# Patient Record
Sex: Male | Born: 1990 | Race: Black or African American | Hispanic: No | Marital: Married | Smoking: Never smoker
Health system: Southern US, Community
[De-identification: ages and names within clinical notes are randomized; demographics above are authoritative.]

## PROBLEM LIST (undated history)

## (undated) DIAGNOSIS — W3400XA Accidental discharge from unspecified firearms or gun, initial encounter: Secondary | ICD-10-CM

## (undated) DIAGNOSIS — S71139A Puncture wound without foreign body, unspecified thigh, initial encounter: Secondary | ICD-10-CM

---

## 2015-12-17 ENCOUNTER — Emergency Department (HOSPITAL_COMMUNITY): Payer: Self-pay

## 2015-12-17 ENCOUNTER — Emergency Department (HOSPITAL_COMMUNITY)
Admission: EM | Admit: 2015-12-17 | Discharge: 2015-12-17 | Disposition: A | Discharge status: Home / Self Care | Payer: Self-pay | Attending: Emergency Medicine | Admitting: Emergency Medicine

## 2015-12-17 ENCOUNTER — Encounter (HOSPITAL_COMMUNITY): Payer: Self-pay

## 2015-12-17 DIAGNOSIS — S71139A Puncture wound without foreign body, unspecified thigh, initial encounter: Secondary | ICD-10-CM

## 2015-12-17 DIAGNOSIS — S71139D Puncture wound without foreign body, unspecified thigh, subsequent encounter: Secondary | ICD-10-CM

## 2015-12-17 DIAGNOSIS — M79605 Pain in left leg: Secondary | ICD-10-CM

## 2015-12-17 DIAGNOSIS — W3400XA Accidental discharge from unspecified firearms or gun, initial encounter: Secondary | ICD-10-CM | POA: Insufficient documentation

## 2015-12-17 DIAGNOSIS — S71102D Unspecified open wound, left thigh, subsequent encounter: Secondary | ICD-10-CM | POA: Insufficient documentation

## 2015-12-17 DIAGNOSIS — M795 Residual foreign body in soft tissue: Secondary | ICD-10-CM

## 2015-12-17 DIAGNOSIS — W3400XD Accidental discharge from unspecified firearms or gun, subsequent encounter: Secondary | ICD-10-CM | POA: Insufficient documentation

## 2015-12-17 HISTORY — DX: Accidental discharge from unspecified firearms or gun, initial encounter: W34.00XA

## 2015-12-17 HISTORY — DX: Puncture wound without foreign body, unspecified thigh, initial encounter: S71.139A

## 2015-12-17 MED ORDER — TRAMADOL HCL 50 MG PO TABS: 50.0000 mg | ORAL_TABLET | Freq: Four times a day (QID) | ORAL | 0 refills | Status: DC | PRN

## 2015-12-17 MED ORDER — OXYCODONE-ACETAMINOPHEN 5-325 MG PO TABS
2.0000 | ORAL_TABLET | Freq: Once | ORAL | Status: AC
  Administered 2015-12-17: 2 via ORAL
  Filled 2015-12-17: qty 2

## 2015-12-17 NOTE — ED Notes (Signed)
Bed: WA03 Expected date:  Expected time:  Means of arrival:  Comments: 25 yo multiple complaints

## 2015-12-17 NOTE — Discharge Instructions (Signed)
Return to the Emergency Department if symptoms change or worsen.  Return if you develop a fever or redness or warmth of the leg.  Return to the ER if your leg is cold or you lose feeling in the leg.  Follow up with General Surgery.
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
## 2017-04-28 ENCOUNTER — Observation Stay (HOSPITAL_COMMUNITY)
Admission: EM | Admit: 2017-04-28 | Discharge: 2017-04-29 | Disposition: A | Discharge status: Home / Self Care | Payer: No Typology Code available for payment source | Attending: Orthopedic Surgery | Admitting: Orthopedic Surgery

## 2017-04-28 ENCOUNTER — Emergency Department (HOSPITAL_COMMUNITY): Payer: No Typology Code available for payment source

## 2017-04-28 ENCOUNTER — Encounter (HOSPITAL_COMMUNITY): Payer: Self-pay | Admitting: Emergency Medicine

## 2017-04-28 DIAGNOSIS — F10129 Alcohol abuse with intoxication, unspecified: Secondary | ICD-10-CM | POA: Insufficient documentation

## 2017-04-28 DIAGNOSIS — S32424A Nondisplaced fracture of posterior wall of right acetabulum, initial encounter for closed fracture: Principal | ICD-10-CM | POA: Insufficient documentation

## 2017-04-28 DIAGNOSIS — S32401A Unspecified fracture of right acetabulum, initial encounter for closed fracture: Secondary | ICD-10-CM | POA: Diagnosis present

## 2017-04-28 DIAGNOSIS — S0101XA Laceration without foreign body of scalp, initial encounter: Secondary | ICD-10-CM | POA: Insufficient documentation

## 2017-04-28 DIAGNOSIS — T07XXXA Unspecified multiple injuries, initial encounter: Secondary | ICD-10-CM

## 2017-04-28 DIAGNOSIS — Z79899 Other long term (current) drug therapy: Secondary | ICD-10-CM | POA: Diagnosis not present

## 2017-04-28 DIAGNOSIS — Z23 Encounter for immunization: Secondary | ICD-10-CM | POA: Insufficient documentation

## 2017-04-28 DIAGNOSIS — Z7982 Long term (current) use of aspirin: Secondary | ICD-10-CM | POA: Insufficient documentation

## 2017-04-28 DIAGNOSIS — Y904 Blood alcohol level of 80-99 mg/100 ml: Secondary | ICD-10-CM | POA: Insufficient documentation

## 2017-04-28 DIAGNOSIS — S27322A Contusion of lung, bilateral, initial encounter: Secondary | ICD-10-CM | POA: Diagnosis not present

## 2017-04-28 DIAGNOSIS — Y92410 Unspecified street and highway as the place of occurrence of the external cause: Secondary | ICD-10-CM | POA: Insufficient documentation

## 2017-04-28 LAB — URINALYSIS, ROUTINE W REFLEX MICROSCOPIC
Bilirubin Urine: NEGATIVE
Glucose, UA: NEGATIVE mg/dL
Hgb urine dipstick: NEGATIVE
Ketones, ur: NEGATIVE mg/dL
LEUKOCYTES UA: NEGATIVE
Nitrite: NEGATIVE
PROTEIN: NEGATIVE mg/dL
Specific Gravity, Urine: 1.043 — ABNORMAL HIGH (ref 1.005–1.030)
pH: 5 (ref 5.0–8.0)

## 2017-04-28 LAB — I-STAT CHEM 8, ED
BUN: 14 mg/dL (ref 6–20)
CALCIUM ION: 1.07 mmol/L — AB (ref 1.15–1.40)
Chloride: 104 mmol/L (ref 101–111)
Creatinine, Ser: 1.7 mg/dL — ABNORMAL HIGH (ref 0.61–1.24)
Glucose, Bld: 105 mg/dL — ABNORMAL HIGH (ref 65–99)
HCT: 41 % (ref 39.0–52.0)
Hemoglobin: 13.9 g/dL (ref 13.0–17.0)
Potassium: 3.3 mmol/L — ABNORMAL LOW (ref 3.5–5.1)
SODIUM: 141 mmol/L (ref 135–145)
TCO2: 20 mmol/L — ABNORMAL LOW (ref 22–32)

## 2017-04-28 LAB — CBC
HEMATOCRIT: 39.5 % (ref 39.0–52.0)
HEMOGLOBIN: 13.1 g/dL (ref 13.0–17.0)
MCH: 25.4 pg — ABNORMAL LOW (ref 26.0–34.0)
MCHC: 33.2 g/dL (ref 30.0–36.0)
MCV: 76.7 fL — ABNORMAL LOW (ref 78.0–100.0)
Platelets: 245 10*3/uL (ref 150–400)
RBC: 5.15 MIL/uL (ref 4.22–5.81)
RDW: 15.9 % — ABNORMAL HIGH (ref 11.5–15.5)
WBC: 8 10*3/uL (ref 4.0–10.5)

## 2017-04-28 LAB — COMPREHENSIVE METABOLIC PANEL
ALBUMIN: 4.1 g/dL (ref 3.5–5.0)
ALK PHOS: 51 U/L (ref 38–126)
ALT: 89 U/L — ABNORMAL HIGH (ref 17–63)
ANION GAP: 15 (ref 5–15)
AST: 99 U/L — ABNORMAL HIGH (ref 15–41)
BILIRUBIN TOTAL: 0.8 mg/dL (ref 0.3–1.2)
BUN: 13 mg/dL (ref 6–20)
CALCIUM: 9 mg/dL (ref 8.9–10.3)
CO2: 20 mmol/L — AB (ref 22–32)
Chloride: 106 mmol/L (ref 101–111)
Creatinine, Ser: 1.52 mg/dL — ABNORMAL HIGH (ref 0.61–1.24)
GFR calc non Af Amer: >60 mL/min (ref >60)
GLUCOSE: 108 mg/dL — AB (ref 65–99)
Potassium: 3.3 mmol/L — ABNORMAL LOW (ref 3.5–5.1)
SODIUM: 141 mmol/L (ref 135–145)
TOTAL PROTEIN: 6.7 g/dL (ref 6.5–8.1)

## 2017-04-28 LAB — I-STAT CG4 LACTIC ACID, ED
LACTIC ACID, VENOUS: 4.94 mmol/L — AB (ref 0.5–1.9)
LACTIC ACID, VENOUS: 1.79 mmol/L (ref 0.5–1.9)

## 2017-04-28 LAB — CDS SEROLOGY

## 2017-04-28 LAB — SAMPLE TO BLOOD BANK

## 2017-04-28 LAB — ETHANOL: ALCOHOL ETHYL (B): 94 mg/dL — AB (ref <10)

## 2017-04-28 LAB — PROTIME-INR
INR: 1.09
Prothrombin Time: 14.1 seconds (ref 11.4–15.2)

## 2017-04-28 MED ORDER — IOPAMIDOL (ISOVUE-300) INJECTION 61%
INTRAVENOUS | Status: AC
  Administered 2017-04-28: 100 mL
  Filled 2017-04-28: qty 100

## 2017-04-28 MED ORDER — SODIUM CHLORIDE 0.9 % IV BOLUS (SEPSIS)
1000.0000 mL | Freq: Once | INTRAVENOUS | Status: AC
  Administered 2017-04-28: 1000 mL via INTRAVENOUS

## 2017-04-28 MED ORDER — METHOCARBAMOL 500 MG PO TABS
1000.0000 mg | ORAL_TABLET | Freq: Four times a day (QID) | ORAL | Status: DC | PRN
  Administered 2017-04-28: 1000 mg via ORAL
  Filled 2017-04-28: qty 2

## 2017-04-28 MED ORDER — ONDANSETRON HCL 4 MG/2ML IJ SOLN
4.0000 mg | Freq: Once | INTRAMUSCULAR | Status: AC
  Administered 2017-04-28: 4 mg via INTRAVENOUS
  Filled 2017-04-28: qty 2

## 2017-04-28 MED ORDER — HYDROMORPHONE HCL 1 MG/ML IJ SOLN
1.0000 mg | Freq: Once | INTRAMUSCULAR | Status: AC
  Administered 2017-04-28: 1 mg via INTRAVENOUS
  Filled 2017-04-28: qty 1

## 2017-04-28 MED ORDER — METHOCARBAMOL 500 MG PO TABS
1000.0000 mg | ORAL_TABLET | Freq: Once | ORAL | Status: AC
Start: 2017-04-28 — End: 2017-04-28
  Administered 2017-04-28: 1000 mg via ORAL
  Filled 2017-04-28: qty 2

## 2017-04-28 MED ORDER — OXYCODONE-ACETAMINOPHEN 5-325 MG PO TABS
2.0000 | ORAL_TABLET | Freq: Once | ORAL | Status: AC
  Administered 2017-04-28: 2 via ORAL
  Filled 2017-04-28: qty 2

## 2017-04-28 MED ORDER — IBUPROFEN 200 MG PO TABS
800.0000 mg | ORAL_TABLET | Freq: Three times a day (TID) | ORAL | Status: DC | PRN
  Administered 2017-04-28: 800 mg via ORAL
  Filled 2017-04-28: qty 1

## 2017-04-28 MED ORDER — IBUPROFEN 800 MG PO TABS
800.0000 mg | ORAL_TABLET | Freq: Once | ORAL | Status: AC
  Administered 2017-04-28: 800 mg via ORAL
  Filled 2017-04-28: qty 1

## 2017-04-28 MED ORDER — LIDOCAINE-EPINEPHRINE (PF) 2 %-1:200000 IJ SOLN
30.0000 mL | Freq: Once | INTRAMUSCULAR | Status: AC
  Administered 2017-04-28: 30 mL
  Filled 2017-04-28: qty 40

## 2017-04-28 MED ORDER — SODIUM CHLORIDE 0.9 % IV SOLN: INTRAVENOUS | Status: DC

## 2017-04-28 MED ORDER — TETANUS-DIPHTH-ACELL PERTUSSIS 5-2.5-18.5 LF-MCG/0.5 IM SUSP
0.5000 mL | Freq: Once | INTRAMUSCULAR | Status: AC
  Administered 2017-04-28: 0.5 mL via INTRAMUSCULAR
  Filled 2017-04-28: qty 0.5

## 2017-04-28 MED ORDER — OXYCODONE-ACETAMINOPHEN 5-325 MG PO TABS
2.0000 | ORAL_TABLET | Freq: Four times a day (QID) | ORAL | Status: DC | PRN
  Administered 2017-04-28 – 2017-04-29 (×2): 2 via ORAL
  Filled 2017-04-28 (×2): qty 2

## 2017-04-28 NOTE — ED Notes (Signed)
Family at bedside. 
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
## 2017-04-28 NOTE — ED Notes (Signed)
Patient transported to CT 

## 2017-04-28 NOTE — ED Notes (Signed)
X-ray at bedside

## 2017-04-28 NOTE — ED Provider Notes (Signed)
LACERATION REPAIR Performed by: Georgiana ShoreJessica B Elloise Roark Authorized by: Georgiana ShoreJessica B Kelissa Merlin Consent: Verbal consent obtained. Risks and benefits: risks, benefits and alternatives were discussed Consent given by: patient Patient identity confirmed: provided demographic data Prepped and Draped in normal sterile fashion Wound explored  Laceration Location: forehead  Laceration Length: ~ 10 cm  No Foreign Bodies seen or palpated  Anesthesia: local infiltration  Local anesthetic: lidocaine 2% w epinephrine  Anesthetic total: 4 ml  Irrigation method: syringe Amount of cleaning: standard  Skin closure: 5.0 silk  Number of sutures: 10 (Second lac repair to forehead performed by Dr. Manus Gunningancour 3 simple interrupted)  Technique: Simple interrupted  Patient tolerance: Patient tolerated the procedure well with no immediate complications.     Georgiana ShoreMitchell, Laverle Pillard B, PA-C 04/28/17 [id number]    Glynn Octaveancour, Stephen, MD 04/28/17 [phone]

## 2017-04-28 NOTE — ED Notes (Signed)
Patient transported to X-ray 
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
## 2017-04-28 NOTE — ED Notes (Signed)
Assisted patient to side of bed, patient is unable to bear weight to R leg without significant muscle spasms in hip and R lower back. Notified MD.
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
## 2017-04-28 NOTE — ED Notes (Signed)
ED Provider at bedside. 
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
## 2017-04-29 ENCOUNTER — Encounter (HOSPITAL_COMMUNITY): Payer: Self-pay

## 2017-04-29 ENCOUNTER — Other Ambulatory Visit: Payer: Self-pay

## 2017-04-29 MED ORDER — DOCUSATE SODIUM 100 MG PO CAPS: 100.0000 mg | ORAL_CAPSULE | Freq: Two times a day (BID) | ORAL | 0 refills | Status: AC

## 2017-04-29 MED ORDER — OXYCODONE HCL 5 MG PO TABS: 5.0000 mg | ORAL_TABLET | Freq: Four times a day (QID) | ORAL | 0 refills | Status: AC | PRN

## 2017-04-29 MED ORDER — ACETAMINOPHEN 500 MG PO TABS: 1000.0000 mg | ORAL_TABLET | Freq: Three times a day (TID) | ORAL | 0 refills | Status: AC

## 2017-04-29 MED ORDER — ASPIRIN EC 81 MG PO TBEC: 81.0000 mg | DELAYED_RELEASE_TABLET | Freq: Every day | ORAL | 0 refills | Status: AC

## 2017-04-29 MED ORDER — ASPIRIN EC 81 MG PO TBEC
81.0000 mg | DELAYED_RELEASE_TABLET | Freq: Every day | ORAL | Status: DC
  Administered 2017-04-29: 81 mg via ORAL
  Filled 2017-04-29: qty 1

## 2017-04-29 MED ORDER — ONDANSETRON HCL 4 MG PO TABS: 4.0000 mg | ORAL_TABLET | Freq: Three times a day (TID) | ORAL | 0 refills | Status: AC | PRN

## 2017-04-29 MED ORDER — METHOCARBAMOL 500 MG PO TABS: 500.0000 mg | ORAL_TABLET | Freq: Four times a day (QID) | ORAL | 0 refills | Status: AC | PRN
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
## 2017-04-29 NOTE — Discharge Instructions (Signed)
Follow up for suture removal in 1 week. Return to the ED if you develop new or worsening symptoms.   Diet: As you were doing prior to hospitalization   Dressing:  Keep dressings on and dry until follow up.  Activity:  Increase activity slowly as tolerated, but follow the weight bearing instructions below.  The rules on driving is that you can not be taking narcotics while you drive, and you must feel in control of the vehicle.    Weight Bearing:   Touch Down Weight bearing Right Leg.  Posterior hip precautions: Do not turn toes in, bend at waist past 90 degrees, or cross legs.  To prevent constipation: you may use a stool softener such as -  Colace (over the counter) 100 mg by mouth twice a day  Drink plenty of fluids (prune juice may be helpful) and high fiber foods Miralax (over the counter) for constipation as needed.    Itching:  If you experience itching with your medications, try taking only a single pain pill, or even half a pain pill at a time.  You can also use benadryl over the counter for itching or also to help with sleep.   Precautions:  If you experience chest pain or shortness of breath - call 911 immediately for transfer to the hospital emergency department!!  If you develop a fever greater that 101 F, purulent drainage from wound, increased redness or drainage from wound, or calf pain -- Call the office at [phone]                                                 Follow- Up Appointment:  Please call for an appointment to be seen in 1-2 weeks [place] - [phone]
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
## 2018-06-07 ENCOUNTER — Emergency Department (HOSPITAL_COMMUNITY)
Admission: EM | Admit: 2018-06-07 | Discharge: 2018-06-07 | Disposition: A | Discharge status: Home / Self Care | Payer: Self-pay | Attending: Emergency Medicine | Admitting: Emergency Medicine

## 2018-06-07 ENCOUNTER — Other Ambulatory Visit: Payer: Self-pay

## 2018-06-07 ENCOUNTER — Encounter (HOSPITAL_COMMUNITY): Payer: Self-pay

## 2018-06-07 DIAGNOSIS — R002 Palpitations: Secondary | ICD-10-CM | POA: Insufficient documentation

## 2018-06-07 LAB — CBC
HCT: 40.8 % (ref 39.0–52.0)
HEMOGLOBIN: 12.5 g/dL — AB (ref 13.0–17.0)
MCH: 23.8 pg — AB (ref 26.0–34.0)
MCHC: 30.6 g/dL (ref 30.0–36.0)
MCV: 77.6 fL — AB (ref 80.0–100.0)
PLATELETS: 234 10*3/uL (ref 150–400)
RBC: 5.26 MIL/uL (ref 4.22–5.81)
RDW: 17 % — ABNORMAL HIGH (ref 11.5–15.5)
WBC: 4.2 10*3/uL (ref 4.0–10.5)
nRBC: 0 % (ref 0.0–0.2)

## 2018-06-07 LAB — BASIC METABOLIC PANEL
Anion gap: 6 (ref 5–15)
BUN: 18 mg/dL (ref 6–20)
CHLORIDE: 104 mmol/L (ref 98–111)
CO2: 26 mmol/L (ref 22–32)
Calcium: 9.2 mg/dL (ref 8.9–10.3)
Creatinine, Ser: 1.24 mg/dL (ref 0.61–1.24)
GFR calc Af Amer: >60 mL/min (ref >60)
Glucose, Bld: 96 mg/dL (ref 70–99)
POTASSIUM: 4.4 mmol/L (ref 3.5–5.1)
SODIUM: 136 mmol/L (ref 135–145)
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
